# Patient Record
Sex: Female | Born: 1985 | Race: Black or African American | Hispanic: No | Marital: Single | Smoking: Never smoker
Health system: Southern US, Community
[De-identification: ages and names within clinical notes are randomized; demographics above are authoritative.]

## PROBLEM LIST (undated history)

## (undated) DIAGNOSIS — T7840XA Allergy, unspecified, initial encounter: Secondary | ICD-10-CM

## (undated) DIAGNOSIS — J45909 Unspecified asthma, uncomplicated: Secondary | ICD-10-CM

## (undated) HISTORY — PX: FRACTURE SURGERY: SHX138

## (undated) HISTORY — DX: Unspecified asthma, uncomplicated: J45.909

## (undated) HISTORY — DX: Allergy, unspecified, initial encounter: T78.40XA

---
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
## 2006-01-16 IMAGING — US US OB COMP +14 WK
1 series · 13 of 28 positions shown · non-contrast
Comparison: none

CLINICAL DATA: 17 week 2 day gestational age by LMP. Right sided pelvic pain.

[Series 1: us ob comp +14 wk · 0.29mm/px · 13 of 74 slices shown]
[im 3/74]
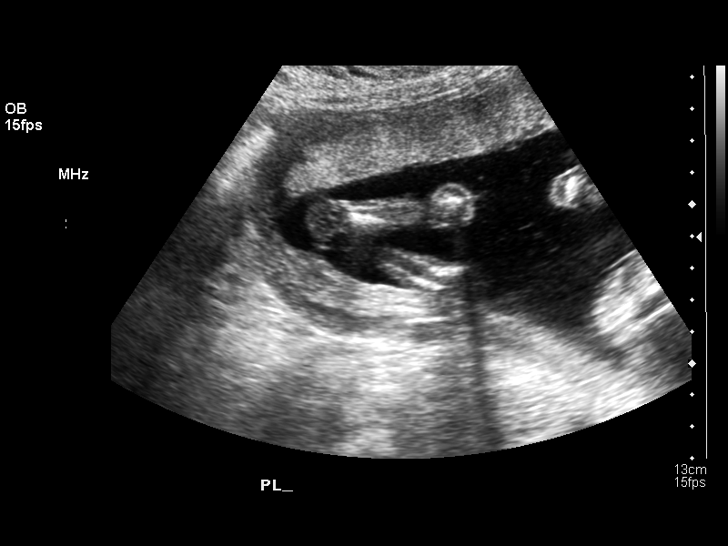
[im 9/74]
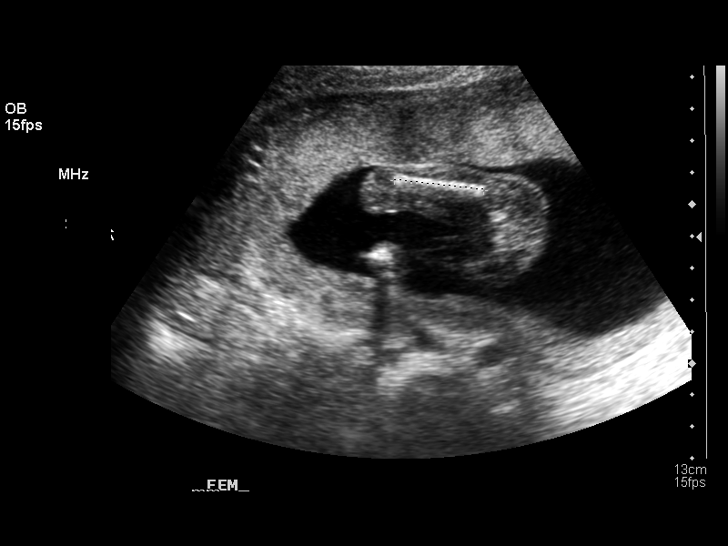
[im 14/74]
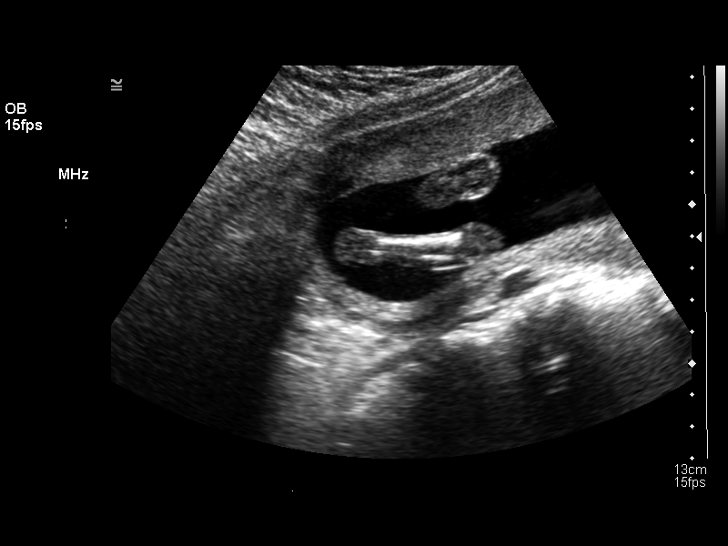
[im 19/74]
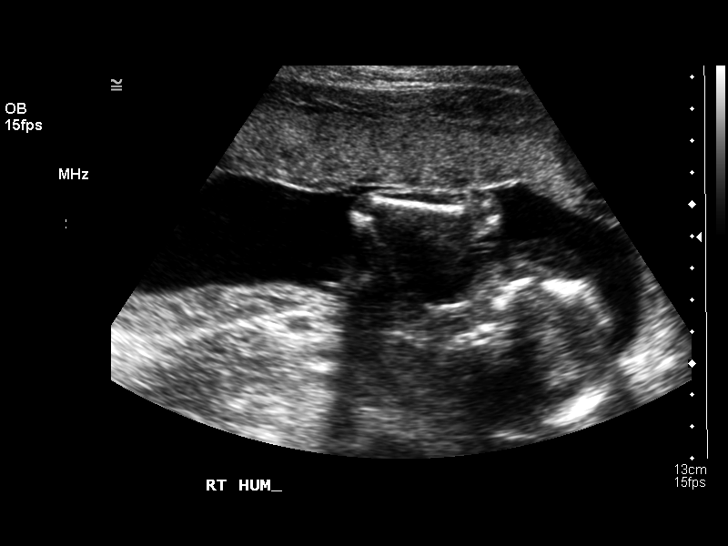
[im 25/74]
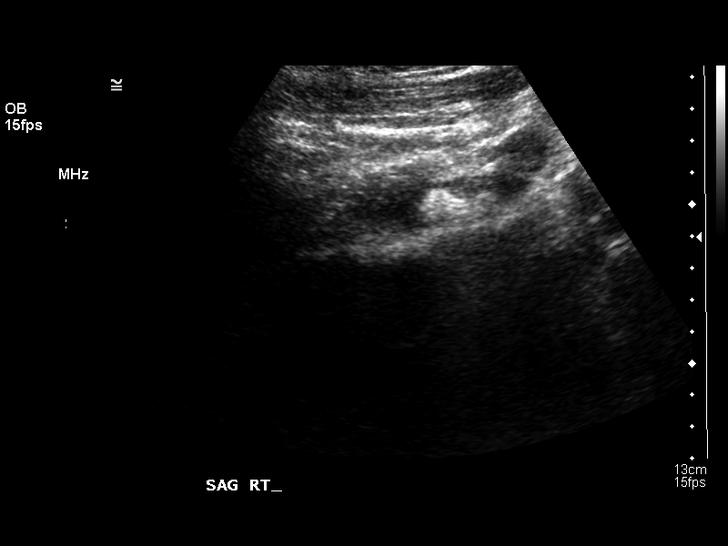
[im 30/74]
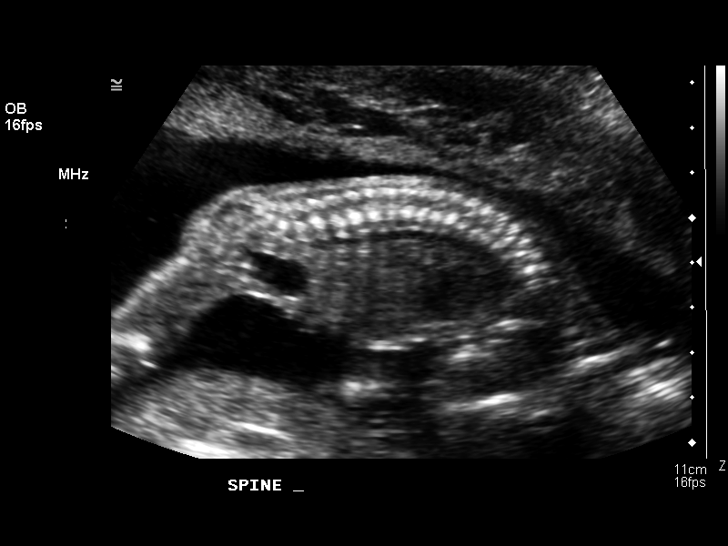
[im 38/74]
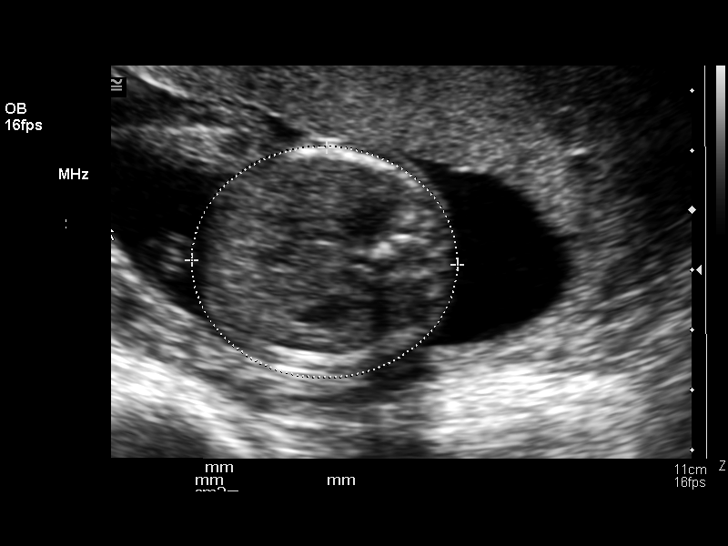
[im 44/74]
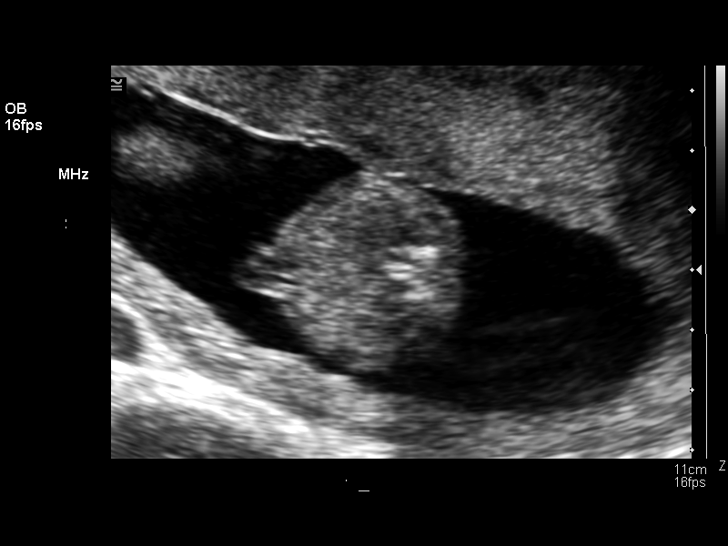
[im 49/74]
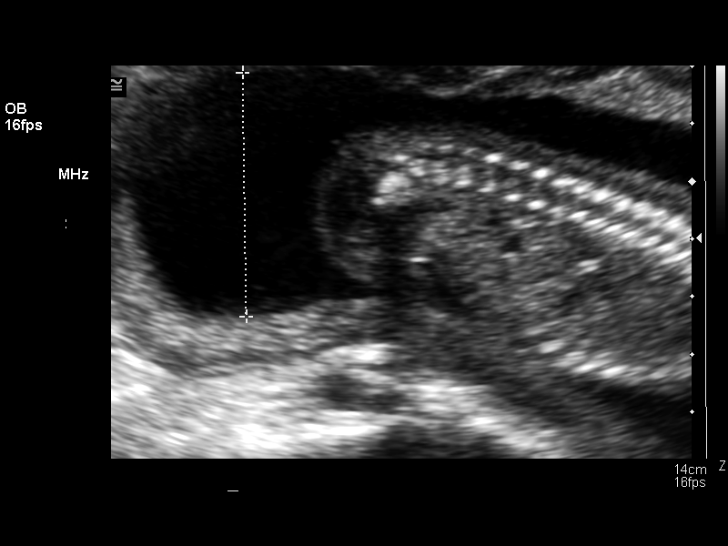
[im 55/74]
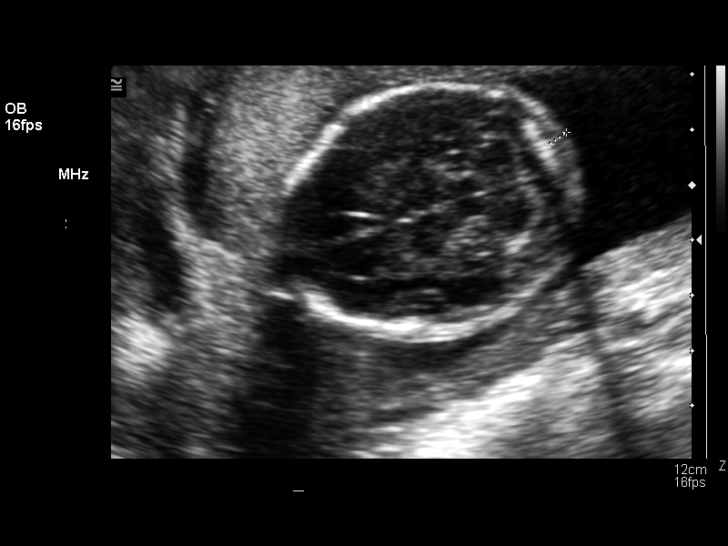
[im 60/74]
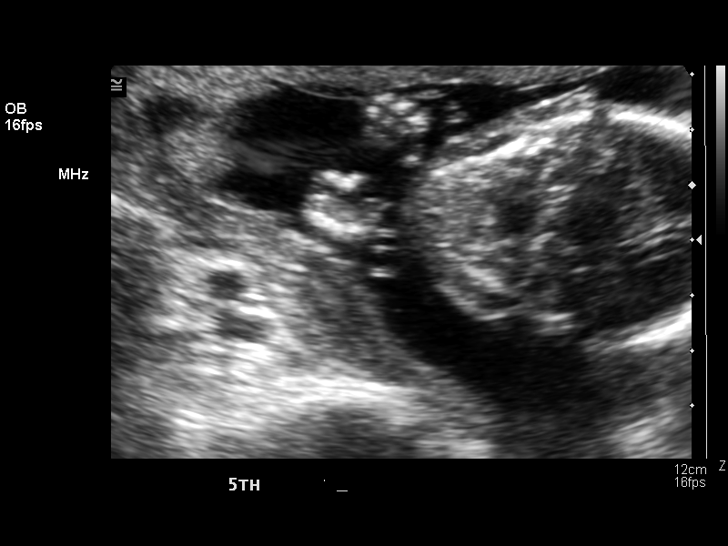
[im 65/74]
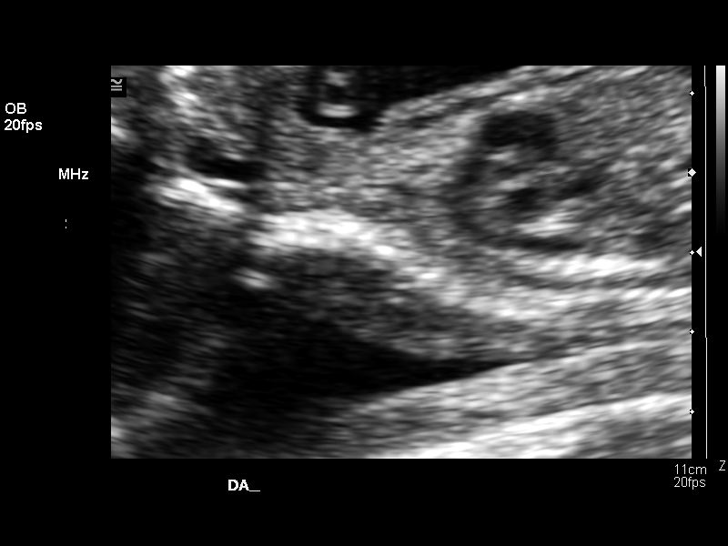
[im 71/74]
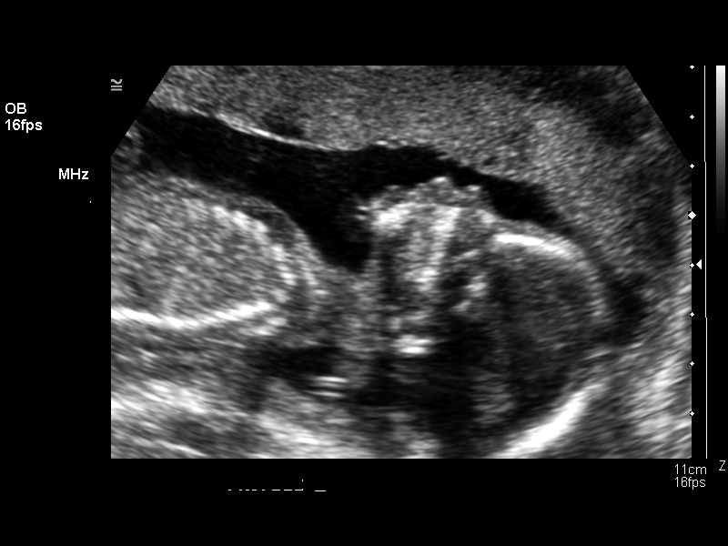

[13 of 28 positions shown; findings below may reference images not displayed]

OBSTETRICAL ULTRASOUND:
 Number of Fetuses:  1
 Heart Rate:  139
 Movement:  Yes
 Breathing:    No  
 Presentation:  Cephalic
 Placental Location:  Anterior
 Grade: I
 Previa:  No
 Amniotic Fluid (Subjective):  Normal
 Amniotic Fluid (Objective):   4.2 cm Vertical pocket 

 FETAL BIOMETRY
 BPD:   4.4 cm   19 w 2 d
 HC:   16.2 cm  19 w 0 d
 AC:   12.6 cm   18 w 2 d
 FL:    2.9 cm   18 w 6 d

 MEAN GA:  18 w 6 d

 FETAL ANATOMY
 Lateral Ventricles:    Visualized 
 Thalami/CSP:      Visualized 
 Posterior Fossa:  Visualized 
 Nuchal Region:    Visualized 
 Spine:      Visualized 
 4 Chamber Heart on Left:      Visualized 
 Stomach on Left:      Visualized 
 3 Vessel Cord:    Visualized 
 Cord Insertion site:    Visualized 
 Kidneys:  Visualized 
 Bladder:  Visualized 
 Extremities:      Visualized 

 ADDITIONAL ANATOMY VISUALIZED:  LVOT, RVOT, orbits, profile, diaphragm, heel, 5th digit, ductal arch, aortic arch, and female genitalia.

 Evaluation limited by:  Fetal position.

 MATERNAL UTERINE AND ADNEXAL FINDINGS
 Cervix:   3.0 cm Transabdominally
IMPRESSION: 1.  Single living intrauterine fetus with mean gestational age of 18 weeks 6 days and sonographic EDC of 10/08/04.  This is 11 days ahead of LMP dating.  
 2.  No evidence of fetal anatomic abnormality.  

 </u12:p>

## 2006-02-10 ENCOUNTER — Inpatient Hospital Stay (HOSPITAL_COMMUNITY): Admission: AD | Admit: 2006-02-10 | Discharge: 2006-02-11 | Payer: Self-pay | Admitting: Obstetrics and Gynecology

## 2007-10-17 IMAGING — US US OB COMP LESS 14 WK
1 series · 14 of 28 positions shown · non-contrast
Comparison: none

CLINICAL DATA: Pelvic pain and vaginal spotting.  5 week 0 day gestational age by LMP.  
 OBSTETRICAL ULTRASOUND <14 WKS AND TRANSVAGINAL OB US:
TECHNIQUE: Both transabdominal and transvaginal ultrasound examinations were performed for complete evaluation of the gestation as well as the maternal uterus, adnexal regions, and pelvic cul-de-sac.

[Series 1: us ob comp less 14 wk · 0.25mm/px · 14 of 37 slices shown]
[im 2/37]
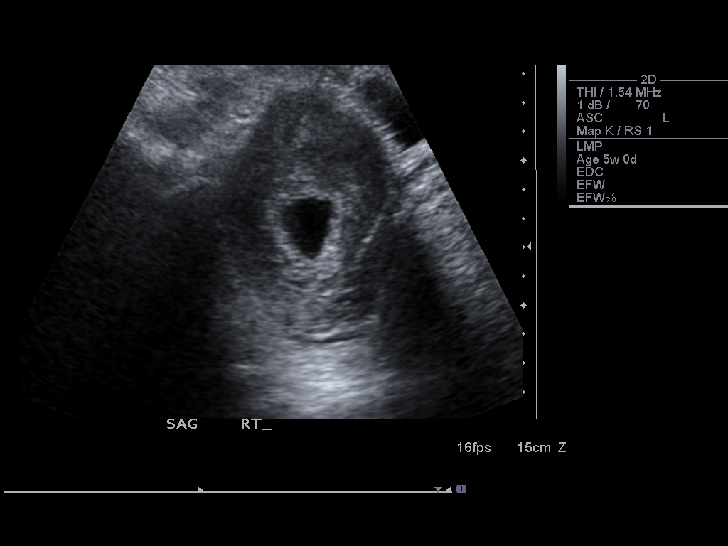
[im 5/37]
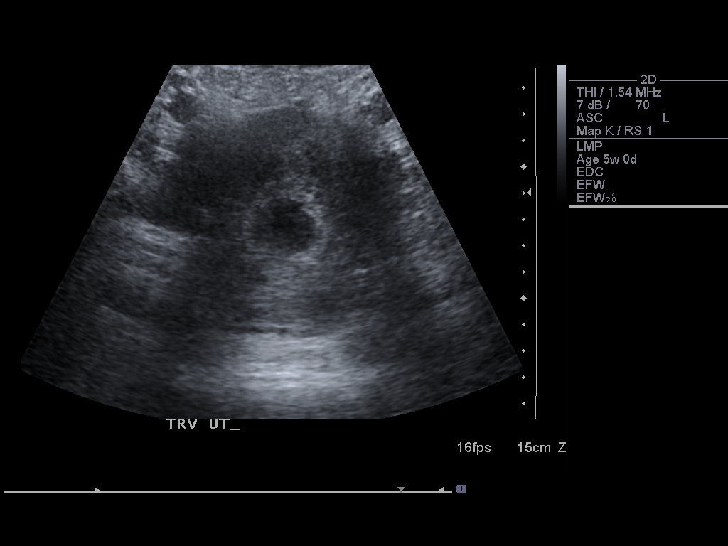
[im 7/37]
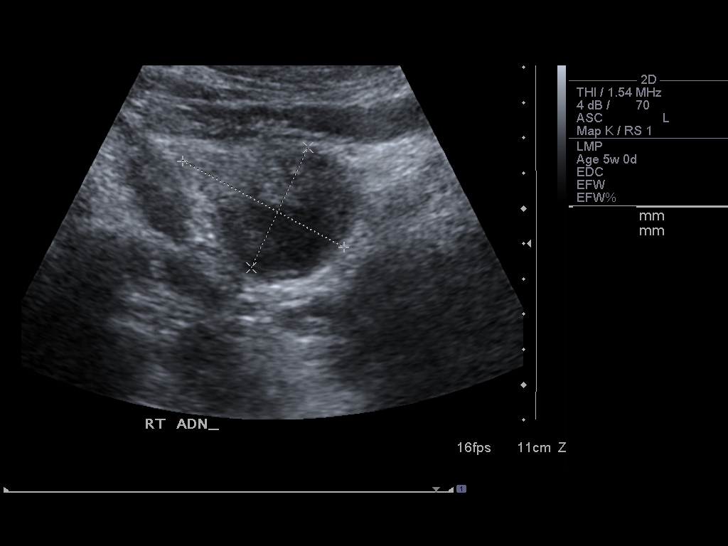
[im 10/37]
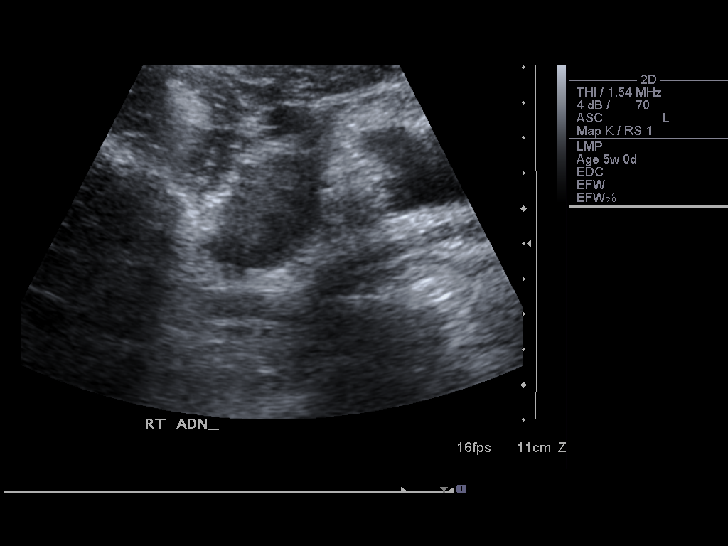
[im 13/37]
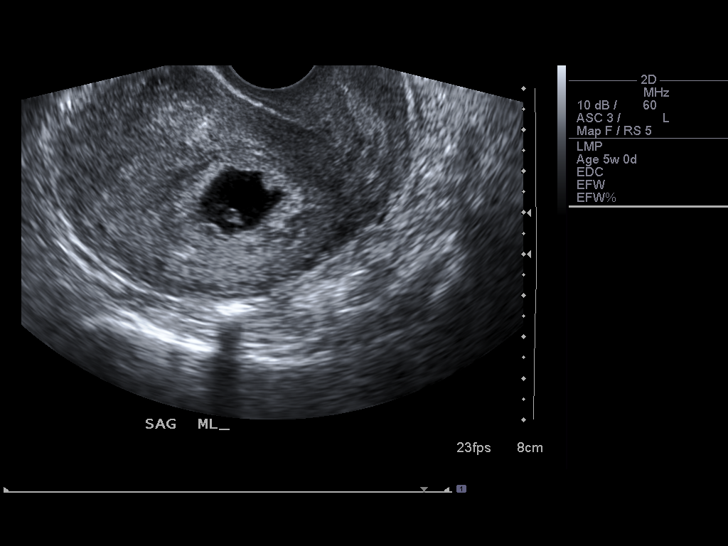
[im 15/37]
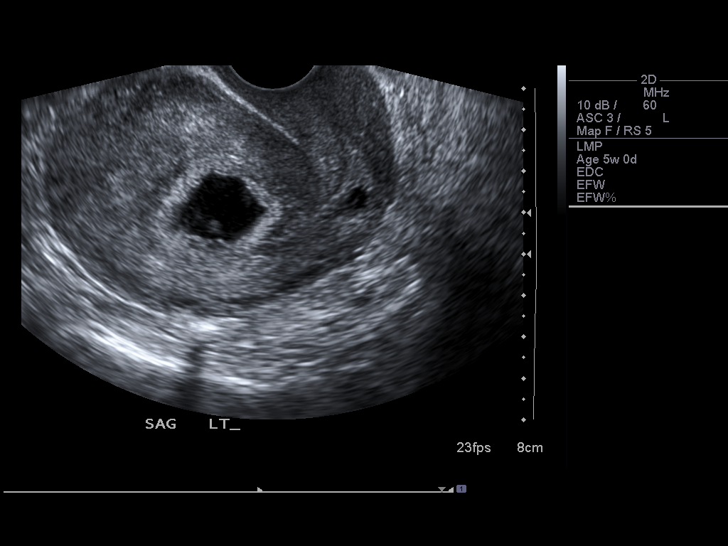
[im 18/37]
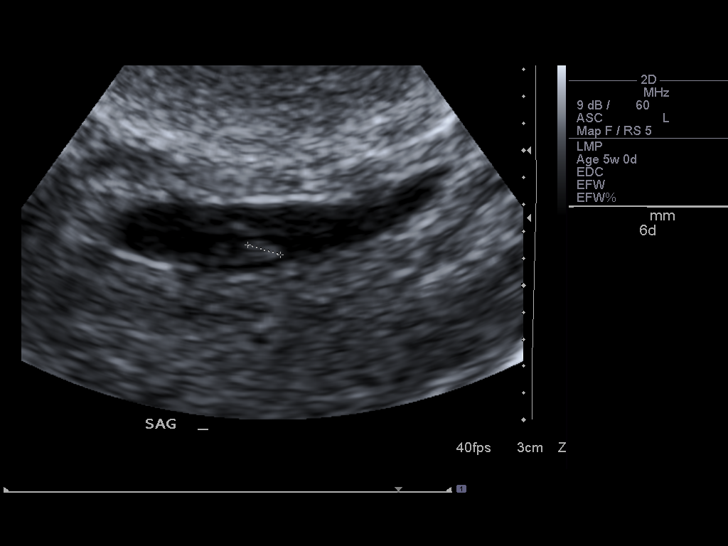
[im 21/37]
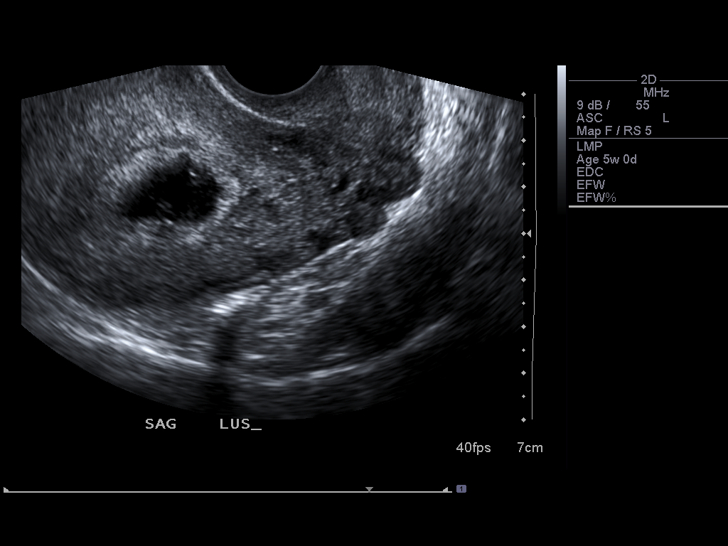
[im 23/37]
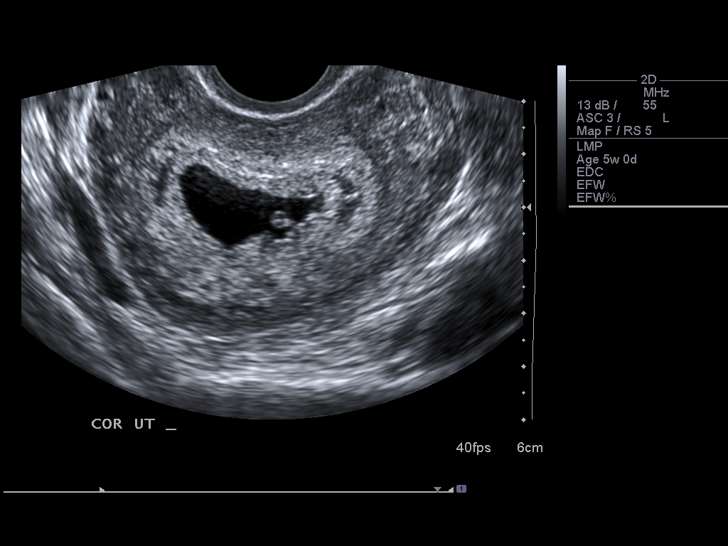
[im 26/37]
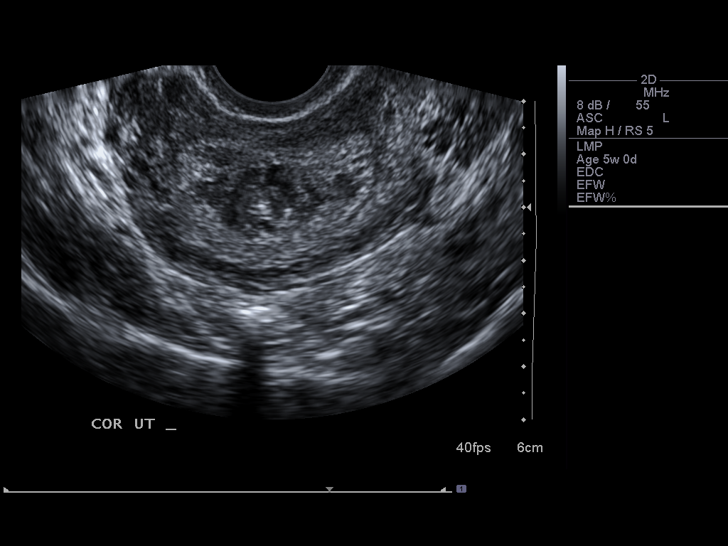
[im 29/37]
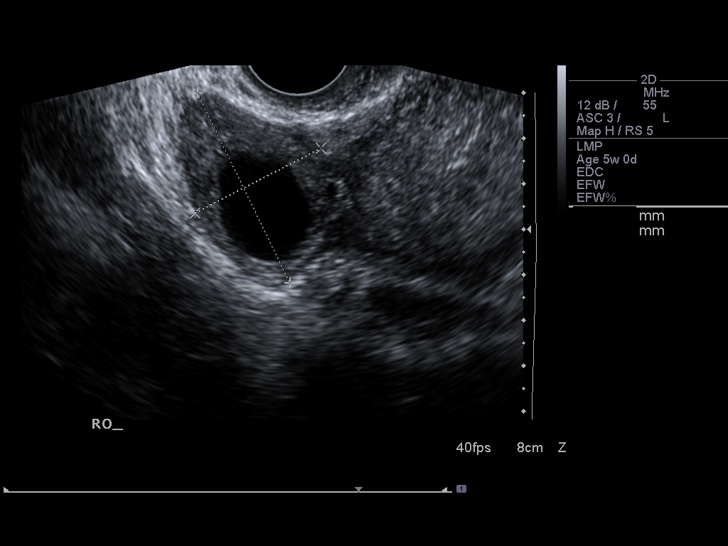
[im 31/37]
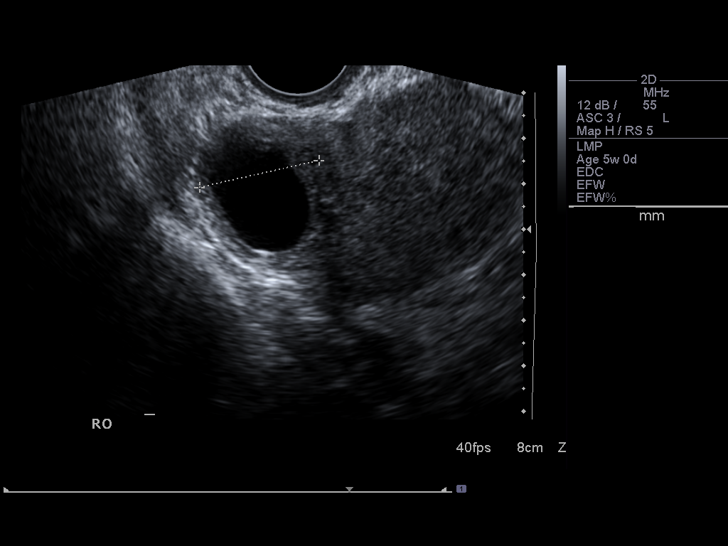
[im 34/37]
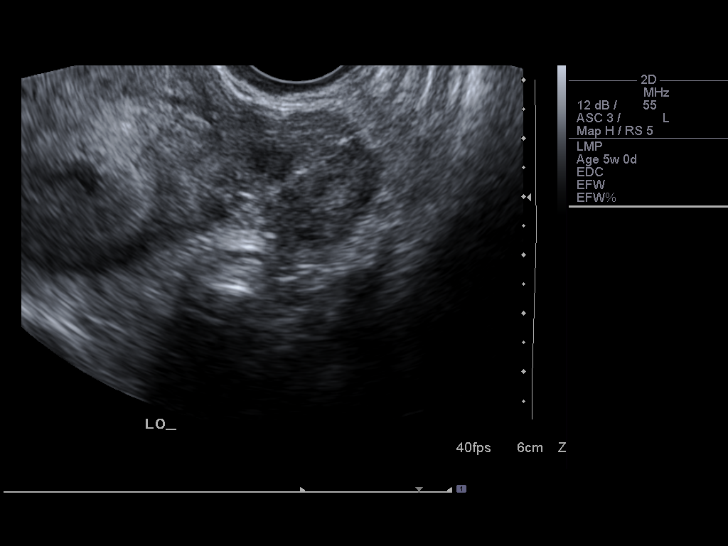
[im 37/37]
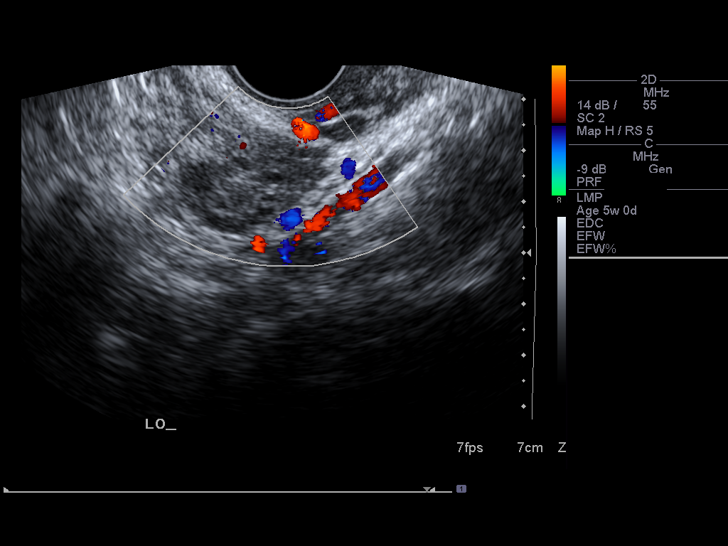

[14 of 28 positions shown; findings below may reference images not displayed]

FINDINGS: A single living intrauterine gestation is seen with measured heart rate of 10/20/05 bpm.  Yolk sac is visualized.  An embryo is seen with crown-rump length of 3 mm, corresponding with a gestational age of 5 weeks 6 days.  Small subchorionic hemorrhage is noted.  No fibroids or other uterine abnormalities are seen.  
 A simple corpus luteum cyst is seen in the right ovary measuring 2 cm.  Left ovary is normal on appearance.  No adnexal masses or free fluid are identified by either transabdominal or transvaginal sonography.
IMPRESSION: 1.  Single living intrauterine gestation with estimated gestational age of 5 weeks 6 days and US EDC of 10/08/06.
 2.  Small subchorionic hemorrhage noted.  
 3.  No evidence of adnexal mass or free fluid.

## 2009-01-16 ENCOUNTER — Emergency Department (HOSPITAL_COMMUNITY): Admission: EM | Admit: 2009-01-16 | Discharge: 2009-01-16 | Payer: Self-pay | Admitting: Emergency Medicine

## 2010-04-21 LAB — RAPID STREP SCREEN (MED CTR MEBANE ONLY): Streptococcus, Group A Screen (Direct): POSITIVE — AB

## 2010-06-06 NOTE — H&P (Signed)
NAMEDASJA, BRASE              ACCOUNT NO.:  [id number]   MEDICAL RECORD NO.:  [id number]          PATIENT TYPE:  INP   LOCATION:  9168                          FACILITY:  [facility]   PHYSICIAN:  Naima A. Dillard, M.D. DATE OF BIRTH:  01-24-1985   DATE OF ADMISSION:  09/03/2004  DATE OF DISCHARGE:                                HISTORY & PHYSICAL   Brittany Roy is a 25 year old single, black female primigravida at 35-1/7 weeks  who presents with regular uterine contractions today. She denies leaking or  bleeding. She was evaluated last night in maternity admissions for  contractions and was discharged home with p.o. Terbutaline and her cervix at  that time was finger tip and 80%, posterior. She was also treated for  Trichomonas that was found in her urine. Her pregnancy has been followed by  the [facility] M.D. service and has been remarkable for:  1.  Asthma.  2.  Teen.  3.  She is Rh negative.  4.  Late to care.  5.  Transferred care from [facility] at 25 weeks.   PRENATAL LABS:  Were collected on May 27, 2004. Blood type is B negative. RPR  negative. Hepatitis B surface antigen negative. Rubella immune. Hemoglobin  electrophoresis was normal. HIV was nonreactive. Gonorrhea negative.  Chlamydia negative. Her maternal serum alpha fetoprotein was within normal  limits.   HISTORY OF PRESENT PREGNANCY:  The patient [facility], [facility] for her first prenatal visit. She had an  ultrasound on May 13, 2004 that gave her best [facility] of October 08, 2004.  She transferred to care at [facility] on July 08, 2004 at 25-5/[redacted] weeks  gestation. The rest of her prenatal care has been unremarkable.   OBSTETRIC HISTORY:  She is a primigravida.   PAST MEDICAL HISTORY:  She has NO MEDICATION ALLERGIES. She experienced  menarche at age 29 with 28-day cycles lasting 4 days. She reports having had  the usual childhood  illnesses. She has had three urinary tract infections.  She was hospitalized at 25 years of age for 2 weeks after a fall down the  steps and questionable injury to spine.   FAMILY HISTORY:  Maternal grandmother with heart disease, hypertension and  diabetes, she is also on dialysis.   GENETIC HISTORY:  Negative.   SOCIAL HISTORY:  The patient is single. Father of the baby's name is Visual merchandiser.  He is involved and supportive. The patient is in the eleventh grade. Father  of the baby has completed twelfth grade and is employed full time at [employer]. They denied any alcohol, tobacco or illicit drug use with the  pregnancy.   PHYSICAL EXAMINATION:  VITAL SIGNS:  Stable, she is afebrile.  HEENT:  Grossly within normal limits.  CHEST:  Clear to auscultation.  HEART:  Regular rate and rhythm.  ABDOMEN:  Gravid in contour with fundal height extending approximately 35 cm  above the pubic symphysis. Fetal heart rate is reactive and reassuring.  Contractions every 3 to 5 minutes. Cervix is  5 cm, 90% with vertex at 0  station with bulging bag of waters.  EXTREMITIES:  Normal.   Group B strep, gonorrhea and Chlamydia were collected. Wet prep was  collected and was negative.   ASSESSMENT:  1.  Intrauterine pregnancy at 35-1/7 weeks.  2.  Active labor.  3.  Group B strep pending.   PLAN:  1.  Admit to birthing suites. Dr. Normand Sloop has been notified.  2.  Routine physician orders.  3.  Start Penicillin G for unknown group B strep.  4.  NICU has been notified due to high census.      Cam Hai, C.N.M.      Naima A. Normand Sloop, M.D.  Electronically Signed    KS/MEDQ  D:  09/04/2004  T:  09/04/2004  Job:  [id number]

## 2013-08-27 ENCOUNTER — Ambulatory Visit (INDEPENDENT_AMBULATORY_CARE_PROVIDER_SITE_OTHER): Payer: 59 | Admitting: Physician Assistant

## 2013-08-27 VITALS — BP 104/76 | HR 87 | Temp 98.2°F | Resp 16 | Ht 67.0 in | Wt 204.0 lb

## 2013-08-27 DIAGNOSIS — K0889 Other specified disorders of teeth and supporting structures: Secondary | ICD-10-CM

## 2013-08-27 DIAGNOSIS — K044 Acute apical periodontitis of pulpal origin: Secondary | ICD-10-CM

## 2013-08-27 DIAGNOSIS — K047 Periapical abscess without sinus: Secondary | ICD-10-CM

## 2013-08-27 DIAGNOSIS — K089 Disorder of teeth and supporting structures, unspecified: Secondary | ICD-10-CM

## 2013-08-27 MED ORDER — AMOXICILLIN 875 MG PO TABS: 875.0000 mg | ORAL_TABLET | Freq: Two times a day (BID) | ORAL | Status: AC

## 2013-08-27 MED ORDER — HYDROCODONE-ACETAMINOPHEN 5-325 MG PO TABS: 1.0000 | ORAL_TABLET | Freq: Three times a day (TID) | ORAL | Status: AC | PRN

## 2013-08-27 NOTE — Progress Notes (Signed)
   Subjective:    Patient ID: Brittany Roy, female    DOB: 06-04-1985, 28 y.o.   MRN: [id number]  HPI 28 year old female presents for evaluation of a possible dental infection. States she had a tooth pulled on 8/4 and since then she has noticed increasing pain and now a discoloration in the gums. She is worried about infection. The dentist that did the procedure is out of town on vacation for 2 weeks.  [place] decided to come in for evaluation.  Has had an rx for pain medication that she has been taking with some relief.   Denies fever, chills, headache, nausea, or vomiting. She is able to open and close her mouth easily. Eating ok.    Review of Systems  Constitutional: Negative for fever and chills.  HENT: Positive for dental problem.   Gastrointestinal: Negative for nausea and vomiting.  Neurological: Negative for headaches.       Objective:   Physical Exam  Constitutional: She is oriented to person, place, and time. She appears well-developed and well-nourished.  HENT:  Head: Normocephalic and atraumatic.  Right Ear: External ear normal.  Left Ear: External ear normal.  Mouth/Throat: Oral lesions present. No trismus in the jaw. Dental caries present. No dental abscesses.    Noted area has exudate, erythema, and swelling. Tooth absent. +TTP. No abscess palpated.   Eyes: Conjunctivae are normal.  Neck: Normal range of motion.  Cardiovascular: Normal rate.   Pulmonary/Chest: Effort normal.  Neurological: She is alert and oriented to person, place, and time.  Psychiatric: She has a normal mood and affect. Her behavior is normal. Judgment and thought content normal.          Assessment & Plan:  Pain, dental - Plan: HYDROcodone-acetaminophen (NORCO) 5-325 MG per tablet  Dental infection - Plan: amoxicillin (AMOXIL) 875 MG tablet  Will treat with amoxicillin 875 mg bid x 10 days. Refilled small quantity of Norco 5/325 mg q8hours prn  pain Recommend f/u with dentist this week - if regular dentist still out of town, recommend f/u else where. RTC precautions discussed. Recheck if symptoms worsening or fail to improve.
